# Patient Record
Sex: Male | Born: 1995 | Hispanic: No | Marital: Married | Smoking: Never smoker
Health system: Southern US, Community
[De-identification: ages and names within clinical notes are randomized; demographics above are authoritative.]

---
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
## 2016-04-06 ENCOUNTER — Ambulatory Visit (INDEPENDENT_AMBULATORY_CARE_PROVIDER_SITE_OTHER): Payer: Worker's Compensation | Admitting: Family Medicine

## 2016-04-06 VITALS — BP 122/78 | HR 60 | Temp 98.4°F | Resp 16 | Ht 71.5 in | Wt 204.0 lb

## 2016-04-06 DIAGNOSIS — S29019A Strain of muscle and tendon of unspecified wall of thorax, initial encounter: Secondary | ICD-10-CM

## 2016-04-06 DIAGNOSIS — M791 Myalgia: Secondary | ICD-10-CM

## 2016-04-06 DIAGNOSIS — S29012A Strain of muscle and tendon of back wall of thorax, initial encounter: Secondary | ICD-10-CM

## 2016-04-06 DIAGNOSIS — M7918 Myalgia, other site: Secondary | ICD-10-CM

## 2016-04-06 MED ORDER — IBUPROFEN 800 MG PO TABS: 800.0000 mg | ORAL_TABLET | Freq: Three times a day (TID) | ORAL | Status: AC | PRN

## 2016-04-06 MED ORDER — CYCLOBENZAPRINE HCL 10 MG PO TABS: 10.0000 mg | ORAL_TABLET | Freq: Three times a day (TID) | ORAL | Status: AC | PRN

## 2016-04-06 MED ORDER — TRAMADOL HCL 50 MG PO TABS: 50.0000 mg | ORAL_TABLET | Freq: Three times a day (TID) | ORAL | Status: AC | PRN

## 2016-04-06 NOTE — Progress Notes (Addendum)
Subjective:  By signing my name below, I, Raven Small, attest that this documentation has been prepared under the direction and in the presence of Norberto Sorenson, MD.  Electronically Signed: Andrew Au, ED Scribe. 04/06/2016. 6:31 PM.   Patient ID: Russell Wood, male    DOB: Nov 04, 1996, 20 y.o.   MRN: [id number]  HPI Chief Complaint  Patient presents with  . Back Pain    HPI Comments: Russell Wood is a 20 y.o. male who presents to the [facility] complaining of gradually worsening right upper back pain that began 1 week ago. Pt states he was lifting a heavy object either a tire or a rear end of a car last week when he felt a knot in right upper back. He has worsening pain in the morning after resting all night. He has tried various stretches, heat, massaging area and ibuprofen 400-500mg  as needed with temporary relief with each treatment. Pt denies hx of back surgery.  Pt is a [employer] and has drill this weekend.   No Known Allergies   Review of Systems  Constitutional: Positive for activity change. Negative for fever, chills and appetite change.  Cardiovascular: Negative for chest pain.  Gastrointestinal: Negative for nausea, vomiting and abdominal pain.  Musculoskeletal: Positive for myalgias and back pain. Negative for joint swelling, arthralgias, gait problem, neck pain and neck stiffness.  Skin: Negative for color change, rash and wound.  Neurological: Negative for weakness and numbness.  Hematological: Negative for adenopathy.  Psychiatric/Behavioral: Negative for sleep disturbance.    Objective:   Physical Exam  Constitutional: He is oriented to person, place, and time. He appears well-developed and well-nourished. No distress.  HENT:  Head: Normocephalic and atraumatic.  Eyes: Conjunctivae and EOM are normal.  Neck: Neck supple.  Cardiovascular: Normal rate.   Pulmonary/Chest: Effort normal.  Musculoskeletal: Normal range of motion.  Pt  locates pain to right tspine medial to scapula.  No tenderness over cervical thoracic. palpable nodule and spasm around T4 at that right paraspinal muscle, very hyperesthetic.  Palpable spasm approximately 1-2 cm but spreads much more in length as it progresses laterally about approximately 5 cm underneath distal medial scapula.   Neurological: He is alert and oriented to person, place, and time.  Skin: Skin is warm and dry.  Psychiatric: He has a normal mood and affect. His behavior is normal.  Nursing note and vitals reviewed.   Filed Vitals:   04/06/16 1805  BP: 122/78  Pulse: 60  Temp: 98.4 F (36.9 C)  TempSrc: Oral  Resp: 16  Height: 5' 11.5" (1.816 m)  Weight: 204 lb (92.534 kg)  SpO2: 97%   Assessment & Plan:   1. Strain of thoracic paraspinal muscles excluding T1 and T2 levels, initial encounter   Ice central, heat over muscle spasm. Cont gentle stretching, try tennis ball agaisnt spasm while sitting. 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  To get long-term relief, may need to consider chiropractor, massage, and/or PT - call if he needs referral.  If sxs do not improve on below regimen, rec RTC for xray.  Meds ordered this encounter  Medications  . ibuprofen (ADVIL,MOTRIN) 800 MG tablet    Sig: Take 1 tablet (800 mg total) by mouth every 8 (eight) hours as needed.    Dispense:  30 tablet  Refill:  1  . cyclobenzaprine (FLEXERIL) 10 MG tablet    Sig: Take 1 tablet (10 mg total) by mouth 3 (three) times daily as needed for muscle spasms.    Dispense:  30 tablet    Refill:  1  . traMADol (ULTRAM) 50 MG tablet    Sig: Take 1 tablet (50 mg total) by mouth every 8 (eight) hours as needed.    Dispense:  30 tablet    Refill:  0   I personally performed the services described in this documentation, which was scribed in my presence. The recorded information has been  reviewed and considered, and addended by me as needed.  Norberto SorensonEva Dickson Kostelnik, MD MPH

## 2016-04-06 NOTE — Patient Instructions (Addendum)
IF you received an x-ray today, you will receive an invoice from [facility]. Please contact [facility] at [phone] with questions or concerns regarding your invoice.   IF you received labwork today, you will receive an invoice from [employer] [facility]. Please contact [facility] at [phone] with questions or concerns regarding your invoice.   Our billing staff will not be able to assist you with questions regarding bills from these companies.  You will be contacted with the lab results as soon as they are available. The fastest way to get your results is to activate your My Chart account. Instructions are located on the last page of this paperwork. If you have not heard from [place] regarding the results in 2 weeks, please contact this office.     Mid-Back Strain With Rehab  A strain is an injury in which a tendon or muscle is torn. The muscles and tendons of the mid-back are vulnerable to strains. However, these muscles and tendons are very strong and require a great force to be injured. The muscles of the mid-back are responsible for stabilizing the spinal column, as well as spinal twisting (rotation). Strains are classified into three categories. Grade 1 strains cause pain, but the tendon is not lengthened. Grade 2 strains include a lengthened ligament, due to the ligament being stretched or partially ruptured. With grade 2 strains there is still function, although the function may be decreased. Grade 3 strains involve a complete tear of the tendon or muscle, and function is usually impaired. SYMPTOMS   Pain in the middle of the back.  Pain that may affect only one side, and is worse with movement.  Muscle spasms, and often swelling in the back.  Loss of strength of the back muscles.  Crackling sound (crepitation) when the muscles are touched. CAUSES  Mid-back strains occur when a force is placed on the muscles or tendons that is greater than  they can handle. Common causes of injury include:  Ongoing overuse of the muscle-tendon units in the middle back, usually from incorrect body posture.  A single violent injury or force applied to the back. RISK INCREASES WITH:  Sports that involve twisting forces on the spine or a lot of bending at the waist (football, rugby, weightlifting, bowling, golf, tennis, speed skating, racquetball, swimming, running, gymnastics, diving).  Poor strength and flexibility.  Failure to warm up properly before activity.  Family history of low back pain or disk disorders.  Previous back injury or surgery (especially fusion). PREVENTION  Learn and use proper sports technique.  Warm up and stretch properly before activity.  Allow for adequate recovery between workouts.  Maintain physical fitness:  Strength, flexibility, and endurance.  Cardiovascular fitness. PROGNOSIS  If treated properly, mid-back strains usually heal within 6 weeks. RELATED COMPLICATIONS   Frequently recurring symptoms, resulting in a chronic problem. Properly treating the problem the first time decreases frequency of recurrence.  Chronic inflammation, scarring, and partial muscle-tendon tear.  Delayed healing or resolution of symptoms, especially if activity is resumed too soon.  Prolonged disability. TREATMENT Treatment first involves the use of ice and medicine, to reduce pain and inflammation. As the pain begins to subside, you may begin strengthening and stretching exercises to improve body posture and sport technique. These exercises may be performed at home or with a therapist. Severe injuries may require referral to a therapist for further evaluation and treatment, such as ultrasound. Corticosteroid injections may be given to help reduce inflammation. Biofeedback (  watching monitors of your body processes) and psychotherapy may also be prescribed. Prolonged bed rest is felt to do more harm than good. Massage may  help break the muscle spasms. Sometimes, an injection of cortisone, with or without local anesthetics, may be given to help relieve the pain and spasms. MEDICATION   If pain medicine is needed, nonsteroidal anti-inflammatory medicines (aspirin and ibuprofen), or other minor pain relievers (acetaminophen), are often advised.  Do not take pain medicine for 7 days before surgery.  Prescription pain relievers may be given, if your caregiver thinks they are needed. Use only as directed and only as much as you need.  Ointments applied to the skin may be helpful.  Corticosteroid injections may be given by your caregiver. These injections should be reserved for the most serious cases, because they may only be given a certain number of times. HEAT AND COLD:   Cold treatment (icing) should be applied for 10 to 15 minutes every 2 to 3 hours for inflammation and pain, and immediately after activity that aggravates your symptoms. Use ice packs or an ice massage.  Heat treatment may be used before performing stretching and strengthening activities prescribed by your caregiver, physical therapist, or athletic trainer. Use a heat pack or a warm water soak. SEEK IMMEDIATE MEDICAL CARE IF:  Symptoms get worse or do not improve in 2 to 4 weeks, despite treatment.  You develop numbness, weakness, or loss of bowel or bladder function.  New, unexplained symptoms develop. (Drugs used in treatment may produce side effects.) EXERCISES RANGE OF MOTION (ROM) AND STRETCHING EXERCISES - Mid-Back Strain These exercises may help you when beginning to rehabilitate your injury. In order to successfully resolve your symptoms, you must improve your posture. These exercises are designed to help reduce the forward-head and rounded-shoulder posture which contributes to this condition. Your symptoms may resolve with or without further involvement from your physician, physical therapist or athletic trainer. While completing these  exercises, remember:   Restoring tissue flexibility helps normal motion to return to the joints. This allows healthier, less painful movement and activity.  An effective stretch should be held for at least 30 seconds.  A stretch should never be painful. You should only feel a gentle lengthening or release in the stretched tissue. STRETCH - Axial Extension  Stand or sit on a firm surface. Assume a good posture: chest up, shoulders drawn back, stomach muscles slightly tense, knees unlocked (if standing) and feet hip width apart.  Slowly retract your chin, so your head slides back and your chin slightly lowers. Continue to look straight ahead.  You should feel a gentle stretch in the back of your head. Be certain not to feel an aggressive stretch since this can cause headaches later.  Hold for __________ seconds. Repeat __________ times. Complete this exercise __________ times per day. RANGE OF MOTION- Upper Thoracic Extension  Sit on a firm chair with a high back. Assume a good posture: chest up, shoulders drawn back, abdominal muscles slightly tense, and feet hip width apart. Place a small pillow or folded towel in the curve of your lower back, if you are having difficulty maintaining good posture.  Gently brace your neck with your hands, allowing your arms to rest on your chest.  Continue to support your neck and slowly extend your back over the chair. You will feel a stretch across your upper back.  Hold __________ seconds. Slowly return to the starting position. Repeat __________ times. Complete this exercise __________ times  per day. RANGE OF MOTION- Mid-Thoracic Extension  Roll a towel so that it is about 4 inches in diameter.  Position the towel lengthwise. Lay on the towel so that your spine, but not your shoulder blades, are supported.  You should feel your mid-back arching toward the floor. To increase the stretch, extend your arms away from your body.  Hold for __________  seconds. Repeat exercise __________ times, __________ times per day. STRENGTHENING EXERCISES - Mid-Back Strain These exercises may help you when beginning to rehabilitate your injury. They may resolve your symptoms with or without further involvement from your physician, physical therapist or athletic trainer. While completing these exercises, remember:   Muscles can gain both the endurance and the strength needed for everyday activities through controlled exercises.  Complete these exercises as instructed by your physician, physical therapist or athletic trainer. Increase the resistance and repetitions only as guided by your caregiver.  You may experience muscle soreness or fatigue, but the pain or discomfort you are trying to eliminate should never worsen during these exercises. If this pain does worsen, stop and make certain you are following the directions exactly. If the pain is still present after adjustments, discontinue the exercise until you can discuss the trouble with your caregiver. STRENGTHENING - Quadruped, Opposite UE/LE Lift  Assume a hands and knees position on a firm surface. Keep your hands under your shoulders and your knees under your hips. You may place padding under your knees for comfort.  Find your neutral spine and gently tense your abdominal muscles so that you can maintain this position. Your shoulders and hips should form a rectangle that is parallel with the floor and is not twisted.  Keeping your trunk steady, lift your right hand no higher than your shoulder and then your left leg no higher than your hip. Make sure you are not holding your breath. Hold this position __________ seconds.  Continuing to keep your abdominal muscles tense and your back steady, slowly return to your starting position. Repeat with the opposite arm and leg. Repeat __________ times. Complete this exercise __________ times per day.  STRENGTH - Shoulder Extensors  Secure a rubber exercise  band or tubing to a fixed object (table, pole) so that it is at the height of your shoulders when you are either standing, or sitting on a firm armless chair.  With a thumbs-up grip, grasp an end of the band in each hand. Straighten your elbows and lift your hands straight in front of you at shoulder height. Step back away from the secured end of band, until it becomes tense.  Squeezing your shoulder blades together, pull your hands down to the sides of your thighs. Do not allow your hands to go behind you.  Hold for __________ seconds. Slowly ease the tension on the band, as you reverse the directions and return to the starting position. Repeat __________ times. Complete this exercise __________ times per day.  STRENGTH - Horizontal Abductors Choose one of the two positions to complete this exercise. Prone: lying on stomach:  Lie on your stomach on a firm surface so that your right / left arm overhangs the edge. Rest your forehead on your opposite forearm. With your palm facing the floor and your elbow straight, hold a __________ weight in your hand.  Squeeze your right / left shoulder blade to your mid-back spine and then slowly raise your arm to the height of the bed.  Hold for __________ seconds. Slowly reverse the directions and  return to the starting position, controlling the weight as you lower your arm. Repeat __________ times. Complete this exercise __________ times per day. Standing:   Secure a rubber exercise band or tubing, so that it is at the height of your shoulders when you are either standing, or sitting on a firm armless chair.  Grasp an end of the band in each hand and have your palms face each other. Straighten your elbows and lift your hands straight in front of you at shoulder height. Step back away from the secured end of band, until it becomes tense.  Squeeze your shoulder blades together. Keeping your elbows locked and your hands at shoulder height, spread your arms  apart, forming a "T" shape with your body. Hold __________ seconds. Slowly ease the tension on the band, as you reverse the directions and return to the starting position. Repeat __________ times. Complete this exercise __________ times per day. STRENGTH - Scapular Retractors and External Rotators, Rowing  Secure a rubber exercise band or tubing, so that it is at the height of your shoulders when you are either standing, or sitting on a firm armless chair.  With a palm-down grip, grasp an end of the band in each hand. Straighten your elbows and lift your hands straight in front of you at shoulder height. Step back away from the secured end of band, until it becomes tense.  Step 1: Squeeze your shoulder blades together. Bending your elbows, draw your hands to your chest as if you are rowing a boat. At the end of this motion, your hands and elbow should be at shoulder height and your elbows should be out to your sides.  Step 2: Rotate your shoulder to raise your hands above your head. Your forearms should be vertical and your upper arms should be horizontal.  Hold for __________ seconds. Slowly ease the tension on the band, as you reverse the directions and return to the starting position. Repeat __________ times. Complete this exercise __________ times per day.  POSTURE AND BODY MECHANICS CONSIDERATIONS - Mid-Back Strain Keeping correct posture when sitting, standing or completing your activities will reduce the stress put on different body tissues, allowing injured tissues a chance to heal and limiting painful experiences. The following are general guidelines for improved posture. Your physician or physical therapist will provide you with any instructions specific to your needs. While reading these guidelines, remember:  The exercises prescribed by your provider will help you have the flexibility and strength to maintain correct postures.  The correct posture provides the best environment for your  joints to work. All of your joints have less wear and tear when properly supported by a spine with good posture. This means you will experience a healthier, less painful body.  Correct posture must be practiced with all of your activities, especially prolonged sitting and standing. Correct posture is as important when doing repetitive low-stress activities (typing) as it is when doing a single heavy-load activity (lifting). PROPER SITTING POSTURE In order to minimize stress and discomfort on your spine, you must sit with correct posture. Sitting with good posture should be effortless for a healthy body. Returning to good posture is a gradual process. Many people can work toward this most comfortably by using various supports until they have the flexibility and strength to maintain this posture on their own. When sitting with proper posture, your ears will fall over your shoulders and your shoulders will fall over your hips. You should use the back of  the chair to support your upper back. Your lower back will be in a neutral position, just slightly arched. You may place a small pillow or folded towel at the base of your low back for  support.  When working at a desk, create an environment that supports good, upright posture. Without extra support, muscles fatigue and lead to excessive strain on joints and other tissues. Keep these recommendations in mind: CHAIR:  A chair should be able to slide under your desk when your back makes contact with the back of the chair. This allows you to work closely.  The chair's height should allow your eyes to be level with the upper part of your monitor and your hands to be slightly lower than your elbows. BODY POSITION  Your feet should make contact with the floor. If this is not possible, use a foot rest.  Keep your ears over your shoulders. This will reduce stress on your neck and lower back. INCORRECT SITTING POSTURES If you are feeling tired and unable to  assume a healthy sitting posture, do not slouch or slump. This puts excessive strain on your back tissues, causing more damage and pain. Healthier options include:  Using more support, like a lumbar pillow.  Switching tasks to something that requires you to be upright or walking.  Talking a brief walk.  Lying down to rest in a neutral-spine position. CORRECT STANDING POSTURES Proper standing posture should be assumed with all daily activities, even if they only take a few moments, like when brushing your teeth. As in sitting, your ears should fall over your shoulders and your shoulders should fall over your hips. You should keep a slight tension in your abdominal muscles to brace your spine. Your tailbone should point down to the ground, not behind your body, resulting in an over-extended swayback posture.  INCORRECT STANDING POSTURES Common incorrect standing postures include a forward head, locked knees, and an excessive swayback. WALKING Walk with an upright posture. Your ears, shoulders and hips should all line-up. CORRECT LIFTING TECHNIQUES DO :   Assume a wide stance. This will provide you more stability and the opportunity to get as close as possible to the object which you are lifting.  Tense your abdominals to brace your spine. Bend at the knees and hips. Keeping your back locked in a neutral-spine position, lift using your leg muscles. Lift with your legs, keeping your back straight.  Test the weight of unknown objects before attempting to lift them.  Try to keep your elbows locked down at your sides in order get the best strength from your shoulders when carrying an object.  Always ask for help when lifting heavy or awkward objects. INCORRECT LIFTING TECHNIQUES DO NOT:   Lock your knees when lifting, even if it is a small object.  Bend and twist. Pivot at your feet or move your feet when needing to change directions.  Assume that you can safely pick up even a paperclip  without proper posture.   This information is not intended to replace advice given to you by your health care provider. Make sure you discuss any questions you have with your health care provider.   Document Released: 11/19/2005 Document Revised: 04/05/2015 Document Reviewed: 03/03/2009 Elsevier Interactive Patient Education [employer]

## 2016-10-24 ENCOUNTER — Ambulatory Visit
Admission: RE | Admit: 2016-10-24 | Discharge: 2016-10-24 | Disposition: A | Discharge status: Home / Self Care | Payer: Worker's Compensation | Source: Ambulatory Visit | Attending: Family Medicine | Admitting: Family Medicine

## 2016-10-24 ENCOUNTER — Other Ambulatory Visit: Payer: Self-pay | Admitting: Family Medicine

## 2016-10-24 DIAGNOSIS — M79641 Pain in right hand: Secondary | ICD-10-CM

## 2018-05-21 IMAGING — CR DG HAND COMPLETE 3+V*R*
3 series · 3 of 3 positions shown · non-contrast
Comparison: None.

CLINICAL DATA: Pain following fall

EXAM:
RIGHT HAND - COMPLETE 3+ VIEW

[x hand pa right]
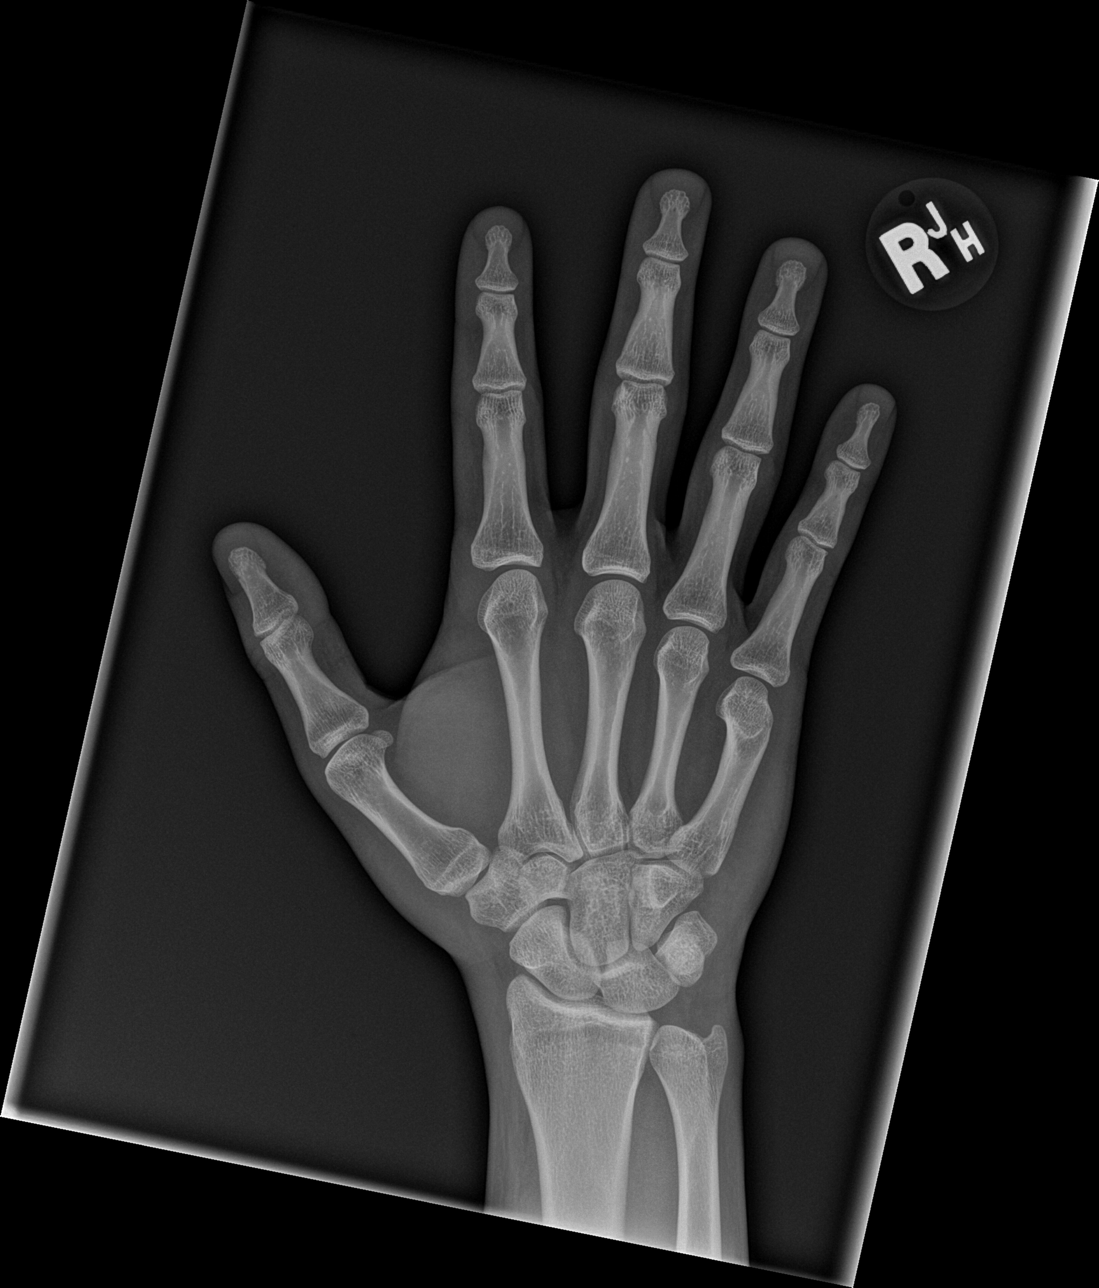

[x hand obl right]
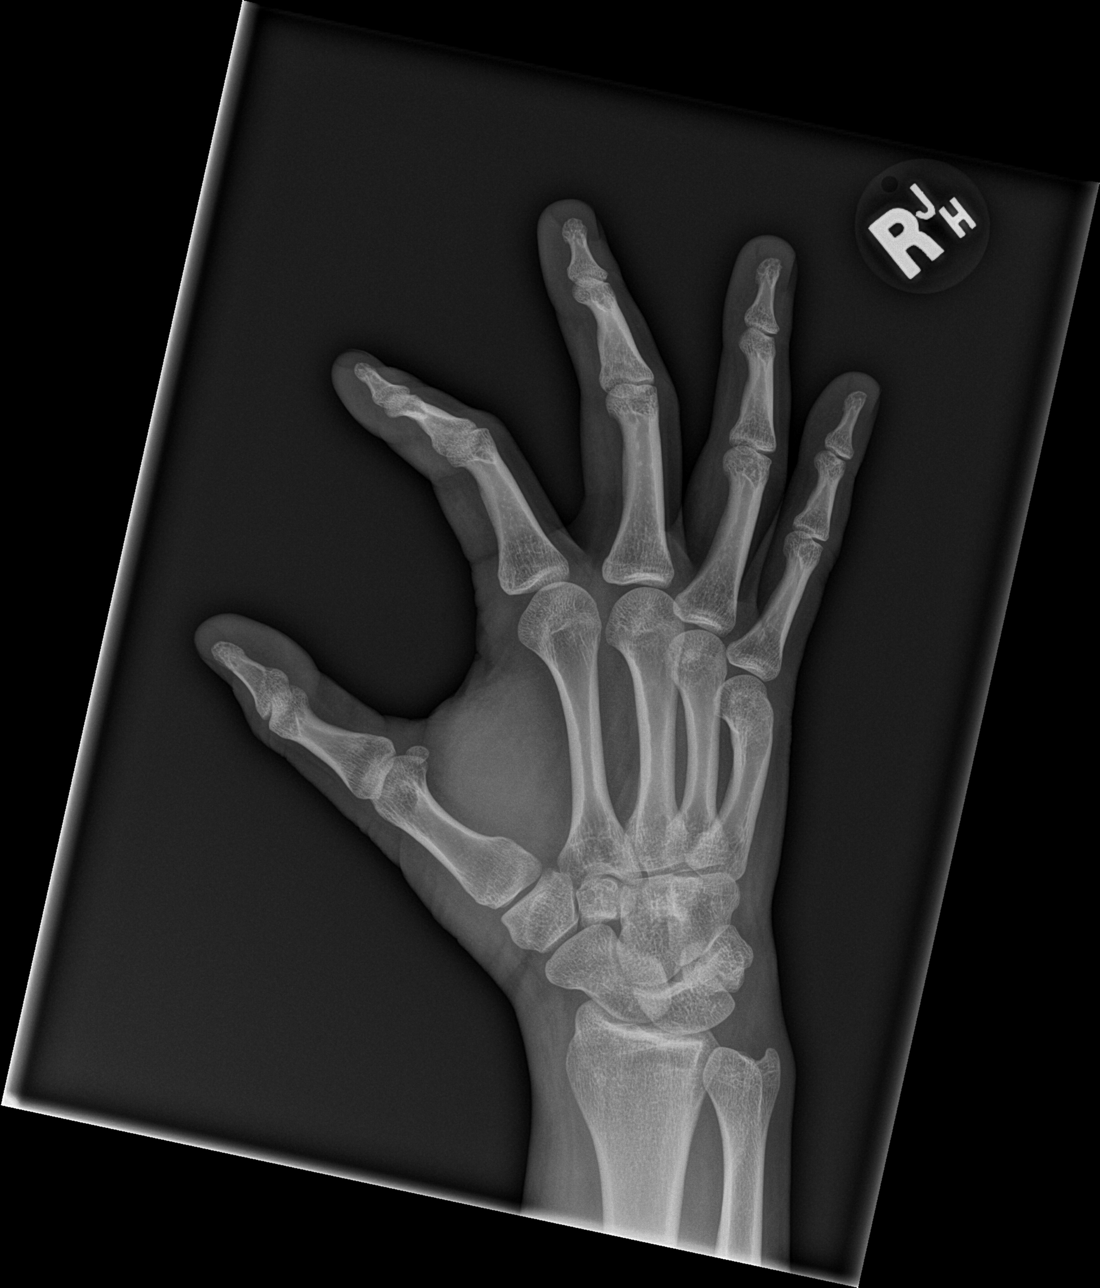

[x hand lat right]
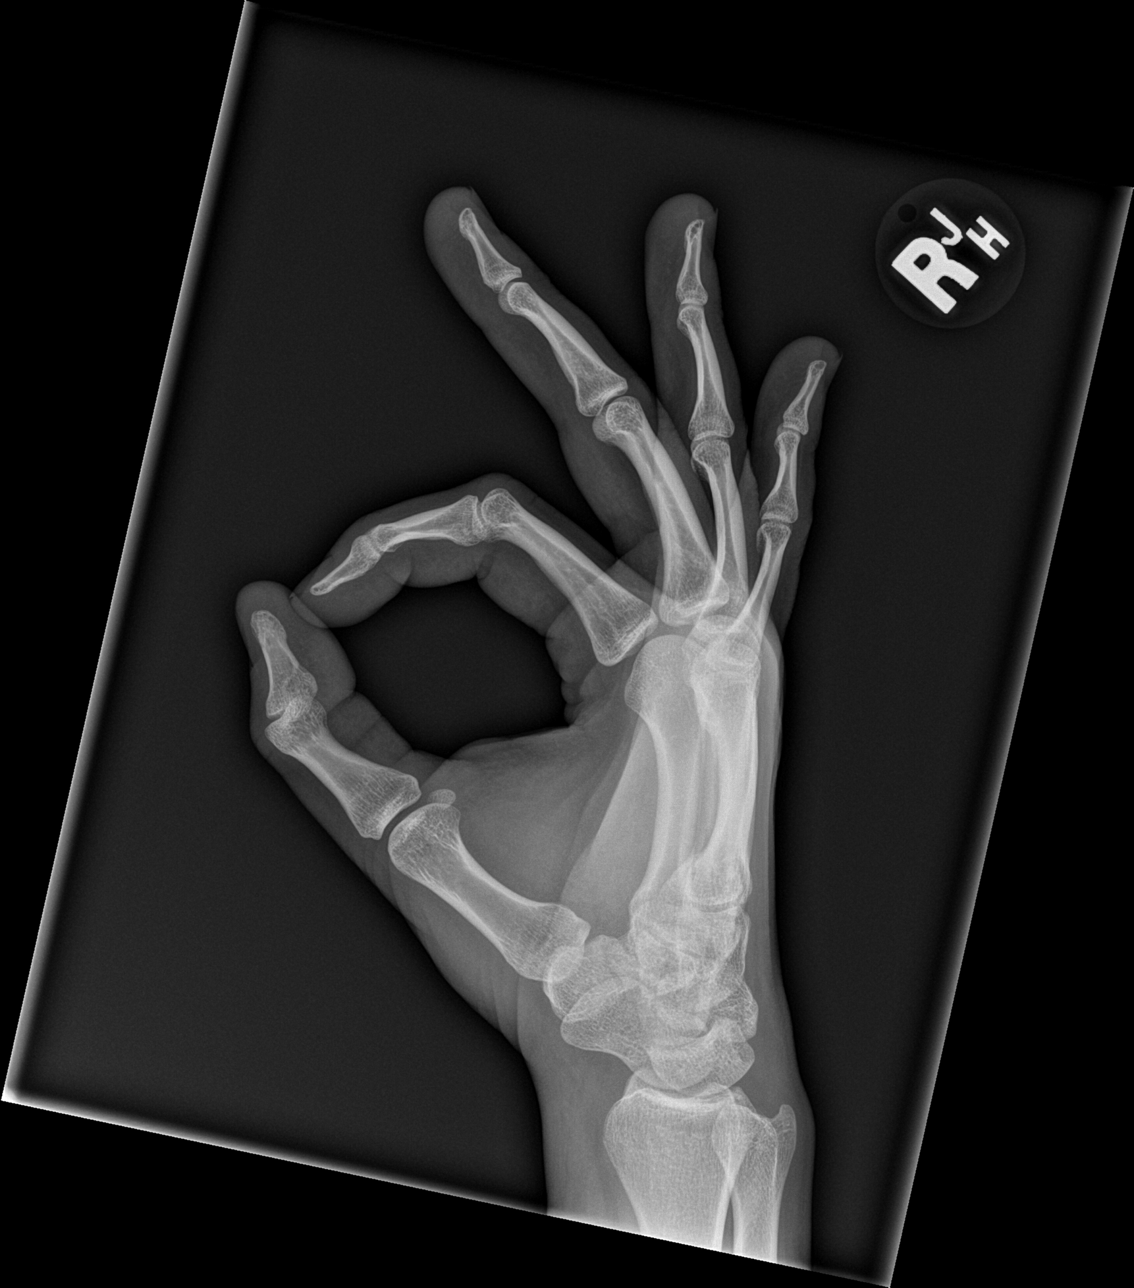

[3 of 3 positions shown; findings below may reference images not displayed]

FINDINGS: Frontal, oblique, and lateral views were obtained. There is evidence
of old healed fracture of the distal fifth metacarpal with
remodeling. There is no evident acute fracture or dislocation. The
joint spaces appear normal. No erosive change.
IMPRESSION: Old healed fracture distal fifth metacarpal with remodeling. No
acute fracture or dislocation. No joint space narrowing or erosion.
